# Patient Record
Sex: Female | Born: 1955 | Race: White | Hispanic: No | Marital: Single | Smoking: Current some day smoker
Health system: Southern US, Community
[De-identification: ages and names within clinical notes are randomized; demographics above are authoritative.]

## PROBLEM LIST (undated history)

## (undated) DIAGNOSIS — E119 Type 2 diabetes mellitus without complications: Secondary | ICD-10-CM

## (undated) DIAGNOSIS — H401190 Primary open-angle glaucoma, unspecified eye, stage unspecified: Secondary | ICD-10-CM

## (undated) DIAGNOSIS — I639 Cerebral infarction, unspecified: Secondary | ICD-10-CM

## (undated) DIAGNOSIS — M0520 Rheumatoid vasculitis with rheumatoid arthritis of unspecified site: Secondary | ICD-10-CM

## (undated) DIAGNOSIS — H548 Legal blindness, as defined in USA: Secondary | ICD-10-CM

## (undated) DIAGNOSIS — I499 Cardiac arrhythmia, unspecified: Secondary | ICD-10-CM

## (undated) DIAGNOSIS — J449 Chronic obstructive pulmonary disease, unspecified: Secondary | ICD-10-CM

## (undated) DIAGNOSIS — G40909 Epilepsy, unspecified, not intractable, without status epilepticus: Secondary | ICD-10-CM

## (undated) DIAGNOSIS — I959 Hypotension, unspecified: Secondary | ICD-10-CM

## (undated) DIAGNOSIS — J439 Emphysema, unspecified: Secondary | ICD-10-CM

## (undated) HISTORY — PX: BLADDER SUSPENSION: SHX72

## (undated) HISTORY — PX: EYE SURGERY: SHX253

---

## 2015-09-16 ENCOUNTER — Emergency Department (HOSPITAL_COMMUNITY): Payer: Medicare Other

## 2015-09-16 ENCOUNTER — Emergency Department (HOSPITAL_COMMUNITY)
Admission: EM | Admit: 2015-09-16 | Discharge: 2015-09-16 | Disposition: A | Discharge status: Home / Self Care | Payer: Medicare Other | Attending: Emergency Medicine | Admitting: Emergency Medicine

## 2015-09-16 ENCOUNTER — Encounter (HOSPITAL_COMMUNITY): Payer: Self-pay | Admitting: *Deleted

## 2015-09-16 DIAGNOSIS — E119 Type 2 diabetes mellitus without complications: Secondary | ICD-10-CM | POA: Insufficient documentation

## 2015-09-16 DIAGNOSIS — M79605 Pain in left leg: Secondary | ICD-10-CM | POA: Insufficient documentation

## 2015-09-16 DIAGNOSIS — G40909 Epilepsy, unspecified, not intractable, without status epilepticus: Secondary | ICD-10-CM | POA: Insufficient documentation

## 2015-09-16 DIAGNOSIS — R079 Chest pain, unspecified: Secondary | ICD-10-CM | POA: Diagnosis present

## 2015-09-16 DIAGNOSIS — M79604 Pain in right leg: Secondary | ICD-10-CM | POA: Insufficient documentation

## 2015-09-16 DIAGNOSIS — Z79899 Other long term (current) drug therapy: Secondary | ICD-10-CM | POA: Diagnosis not present

## 2015-09-16 DIAGNOSIS — R062 Wheezing: Secondary | ICD-10-CM

## 2015-09-16 DIAGNOSIS — H548 Legal blindness, as defined in USA: Secondary | ICD-10-CM | POA: Diagnosis not present

## 2015-09-16 DIAGNOSIS — R112 Nausea with vomiting, unspecified: Secondary | ICD-10-CM | POA: Diagnosis not present

## 2015-09-16 DIAGNOSIS — J441 Chronic obstructive pulmonary disease with (acute) exacerbation: Secondary | ICD-10-CM | POA: Diagnosis not present

## 2015-09-16 DIAGNOSIS — K002 Abnormalities of size and form of teeth: Secondary | ICD-10-CM | POA: Insufficient documentation

## 2015-09-16 DIAGNOSIS — F17200 Nicotine dependence, unspecified, uncomplicated: Secondary | ICD-10-CM | POA: Insufficient documentation

## 2015-09-16 DIAGNOSIS — R0789 Other chest pain: Secondary | ICD-10-CM | POA: Diagnosis not present

## 2015-09-16 DIAGNOSIS — M069 Rheumatoid arthritis, unspecified: Secondary | ICD-10-CM | POA: Insufficient documentation

## 2015-09-16 DIAGNOSIS — R2242 Localized swelling, mass and lump, left lower limb: Secondary | ICD-10-CM | POA: Diagnosis not present

## 2015-09-16 DIAGNOSIS — Z7951 Long term (current) use of inhaled steroids: Secondary | ICD-10-CM | POA: Insufficient documentation

## 2015-09-16 DIAGNOSIS — H409 Unspecified glaucoma: Secondary | ICD-10-CM | POA: Insufficient documentation

## 2015-09-16 DIAGNOSIS — R2241 Localized swelling, mass and lump, right lower limb: Secondary | ICD-10-CM | POA: Diagnosis not present

## 2015-09-16 DIAGNOSIS — Z8673 Personal history of transient ischemic attack (TIA), and cerebral infarction without residual deficits: Secondary | ICD-10-CM | POA: Insufficient documentation

## 2015-09-16 HISTORY — DX: Epilepsy, unspecified, not intractable, without status epilepticus: G40.909

## 2015-09-16 HISTORY — DX: Chronic obstructive pulmonary disease, unspecified: J44.9

## 2015-09-16 HISTORY — DX: Type 2 diabetes mellitus without complications: E11.9

## 2015-09-16 HISTORY — DX: Emphysema, unspecified: J43.9

## 2015-09-16 HISTORY — DX: Rheumatoid vasculitis with rheumatoid arthritis of unspecified site: M05.20

## 2015-09-16 HISTORY — DX: Cerebral infarction, unspecified: I63.9

## 2015-09-16 HISTORY — DX: Legal blindness, as defined in USA: H54.8

## 2015-09-16 HISTORY — DX: Hypotension, unspecified: I95.9

## 2015-09-16 HISTORY — DX: Primary open-angle glaucoma, unspecified eye, stage unspecified: H40.1190

## 2015-09-16 HISTORY — DX: Cardiac arrhythmia, unspecified: I49.9

## 2015-09-16 LAB — CBC
HEMATOCRIT: 43.3 % (ref 36.0–46.0)
HEMOGLOBIN: 14.2 g/dL (ref 12.0–15.0)
MCH: 31.8 pg (ref 26.0–34.0)
MCHC: 32.8 g/dL (ref 30.0–36.0)
MCV: 96.9 fL (ref 78.0–100.0)
Platelets: 244 10*3/uL (ref 150–400)
RBC: 4.47 MIL/uL (ref 3.87–5.11)
RDW: 13.2 % (ref 11.5–15.5)
WBC: 7.6 10*3/uL (ref 4.0–10.5)

## 2015-09-16 LAB — BRAIN NATRIURETIC PEPTIDE: B NATRIURETIC PEPTIDE 5: 11.8 pg/mL (ref 0.0–100.0)

## 2015-09-16 LAB — BASIC METABOLIC PANEL
ANION GAP: 8 (ref 5–15)
BUN: 15 mg/dL (ref 6–20)
CO2: 27 mmol/L (ref 22–32)
Calcium: 9.6 mg/dL (ref 8.9–10.3)
Chloride: 104 mmol/L (ref 101–111)
Creatinine, Ser: 0.79 mg/dL (ref 0.44–1.00)
GFR calc Af Amer: >60 mL/min (ref >60)
Glucose, Bld: 133 mg/dL — ABNORMAL HIGH (ref 65–99)
POTASSIUM: 3.4 mmol/L — AB (ref 3.5–5.1)
SODIUM: 139 mmol/L (ref 135–145)

## 2015-09-16 LAB — I-STAT TROPONIN, ED
TROPONIN I, POC: 0 ng/mL (ref 0.00–0.08)
TROPONIN I, POC: 0.03 ng/mL (ref 0.00–0.08)

## 2015-09-16 LAB — D-DIMER, QUANTITATIVE: D-Dimer, Quant: 0.56 ug/mL-FEU — ABNORMAL HIGH (ref 0.00–0.50)

## 2015-09-16 MED ORDER — SODIUM CHLORIDE 0.9 % IV BOLUS (SEPSIS): 500.0000 mL | Freq: Once | INTRAVENOUS | Status: DC

## 2015-09-16 MED ORDER — ONDANSETRON HCL 4 MG/2ML IJ SOLN
4.0000 mg | Freq: Once | INTRAMUSCULAR | Status: DC
  Filled 2015-09-16: qty 2

## 2015-09-16 MED ORDER — TECHNETIUM TC 99M DIETHYLENETRIAME-PENTAACETIC ACID: 31.2000 | Freq: Once | INTRAVENOUS | Status: DC | PRN

## 2015-09-16 MED ORDER — ALBUTEROL SULFATE (2.5 MG/3ML) 0.083% IN NEBU
5.0000 mg | INHALATION_SOLUTION | Freq: Once | RESPIRATORY_TRACT | Status: AC
  Administered 2015-09-16: 5 mg via RESPIRATORY_TRACT
  Filled 2015-09-16: qty 6

## 2015-09-16 MED ORDER — TECHNETIUM TO 99M ALBUMIN AGGREGATED
4.2000 | Freq: Once | INTRAVENOUS | Status: AC | PRN
  Administered 2015-09-16: 4 via INTRAVENOUS

## 2015-09-16 NOTE — ED Notes (Signed)
Pt c/o cramping in R leg; MD made aware

## 2015-09-16 NOTE — ED Notes (Signed)
Dr Yelverton at bedside.  

## 2015-09-16 NOTE — Discharge Instructions (Signed)
Nonspecific Chest Pain  °Chest pain can be caused by many different conditions. There is always a chance that your pain could be related to something serious, such as a heart attack or a blood clot in your lungs. Chest pain can also be caused by conditions that are not life-threatening. If you have chest pain, it is very important to follow up with your health care provider. °CAUSES  °Chest pain can be caused by: °· Heartburn. °· Pneumonia or bronchitis. °· Anxiety or stress. °· Inflammation around your heart (pericarditis) or lung (pleuritis or pleurisy). °· A blood clot in your lung. °· A collapsed lung (pneumothorax). It can develop suddenly on its own (spontaneous pneumothorax) or from trauma to the chest. °· Shingles infection (varicella-zoster virus). °· Heart attack. °· Damage to the bones, muscles, and cartilage that make up your chest wall. This can include: °¨ Bruised bones due to injury. °¨ Strained muscles or cartilage due to frequent or repeated coughing or overwork. °¨ Fracture to one or more ribs. °¨ Sore cartilage due to inflammation (costochondritis). °RISK FACTORS  °Risk factors for chest pain may include: °· Activities that increase your risk for trauma or injury to your chest. °· Respiratory infections or conditions that cause frequent coughing. °· Medical conditions or overeating that can cause heartburn. °· Heart disease or family history of heart disease. °· Conditions or health behaviors that increase your risk of developing a blood clot. °· Having had chicken pox (varicella zoster). °SIGNS AND SYMPTOMS °Chest pain can feel like: °· Burning or tingling on the surface of your chest or deep in your chest. °· Crushing, pressure, aching, or squeezing pain. °· Dull or sharp pain that is worse when you move, cough, or take a deep breath. °· Pain that is also felt in your back, neck, shoulder, or arm, or pain that spreads to any of these areas. °Your chest pain may come and go, or it may stay  constant. °DIAGNOSIS °Lab tests or other studies may be needed to find the cause of your pain. Your health care provider may have you take a test called an ambulatory ECG (electrocardiogram). An ECG records your heartbeat patterns at the time the test is performed. You may also have other tests, such as: °· Transthoracic echocardiogram (TTE). During echocardiography, sound waves are used to create a picture of all of the heart structures and to look at how blood flows through your heart. °· Transesophageal echocardiogram (TEE). This is a more advanced imaging test that obtains images from inside your body. It allows your health care provider to see your heart in finer detail. °· Cardiac monitoring. This allows your health care provider to monitor your heart rate and rhythm in real time. °· Holter monitor. This is a portable device that records your heartbeat and can help to diagnose abnormal heartbeats. It allows your health care provider to track your heart activity for several days, if needed. °· Stress tests. These can be done through exercise or by taking medicine that makes your heart beat more quickly. °· Blood tests. °· Imaging tests. °TREATMENT  °Your treatment depends on what is causing your chest pain. Treatment may include: °· Medicines. These may include: °¨ Acid blockers for heartburn. °¨ Anti-inflammatory medicine. °¨ Pain medicine for inflammatory conditions. °¨ Antibiotic medicine, if an infection is present. °¨ Medicines to dissolve blood clots. °¨ Medicines to treat coronary artery disease. °· Supportive care for conditions that do not require medicines. This may include: °¨ Resting. °¨ Applying heat   or cold packs to injured areas. °¨ Limiting activities until pain decreases. °HOME CARE INSTRUCTIONS °· If you were prescribed an antibiotic medicine, finish it all even if you start to feel better. °· Avoid any activities that bring on chest pain. °· Do not use any tobacco products, including  cigarettes, chewing tobacco, or electronic cigarettes. If you need help quitting, ask your health care provider. °· Do not drink alcohol. °· Take medicines only as directed by your health care provider. °· Keep all follow-up visits as directed by your health care provider. This is important. This includes any further testing if your chest pain does not go away. °· If heartburn is the cause for your chest pain, you may be told to keep your head raised (elevated) while sleeping. This reduces the chance that acid will go from your stomach into your esophagus. °· Make lifestyle changes as directed by your health care provider. These may include: °¨ Getting regular exercise. Ask your health care provider to suggest some activities that are safe for you. °¨ Eating a heart-healthy diet. A registered dietitian can help you to learn healthy eating options. °¨ Maintaining a healthy weight. °¨ Managing diabetes, if necessary. °¨ Reducing stress. °SEEK MEDICAL CARE IF: °· Your chest pain does not go away after treatment. °· You have a rash with blisters on your chest. °· You have a fever. °SEEK IMMEDIATE MEDICAL CARE IF:  °· Your chest pain is worse. °· You have an increasing cough, or you cough up blood. °· You have severe abdominal pain. °· You have severe weakness. °· You faint. °· You have chills. °· You have sudden, unexplained chest discomfort. °· You have sudden, unexplained discomfort in your arms, back, neck, or jaw. °· You have shortness of breath at any time. °· You suddenly start to sweat, or your skin gets clammy. °· You feel nauseous or you vomit. °· You suddenly feel light-headed or dizzy. °· Your heart begins to beat quickly, or it feels like it is skipping beats. °These symptoms may represent a serious problem that is an emergency. Do not wait to see if the symptoms will go away. Get medical help right away. Call your local emergency services (911 in the [place] Do not drive yourself to the hospital. °  °This  information is not intended to replace advice given to you by your health care provider. Make sure you discuss any questions you have with your health care provider. °  °Document Released: 07/20/2005 Document Revised: 10/31/2014 Document Reviewed: 05/16/2014 °Elsevier Interactive Patient Education ©2016 [employer] ° °
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
## 2015-09-16 NOTE — ED Notes (Signed)
Patient transported to radiology for VQ scan 
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
## 2016-03-26 IMAGING — CR DG CHEST 2V
2 series · 2 of 2 positions shown · non-contrast
Comparison: None.

CLINICAL DATA: Acute onset of left-sided chest pain. Initial
encounter.

EXAM:
CHEST  2 VIEW

[chest pa]
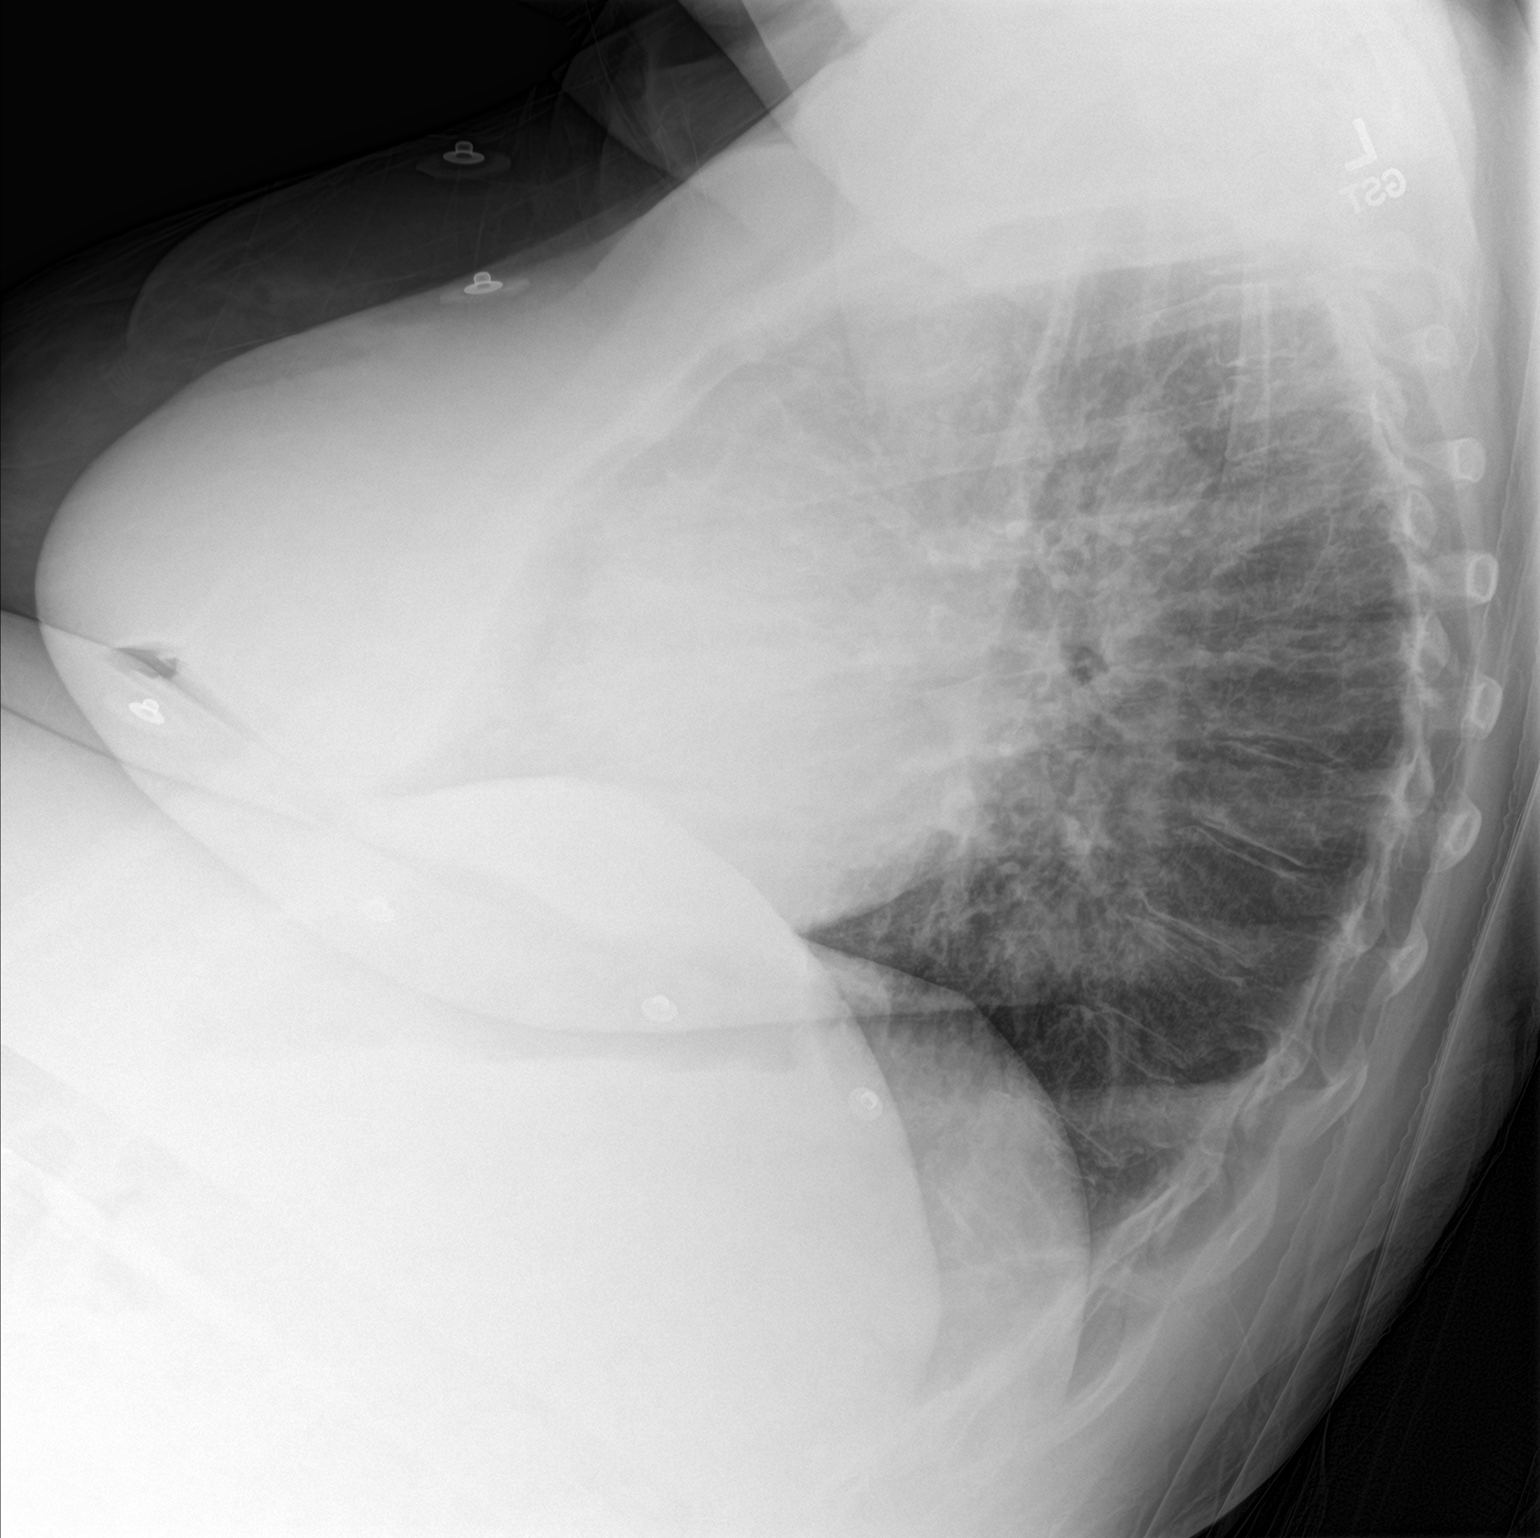

[chest ap]
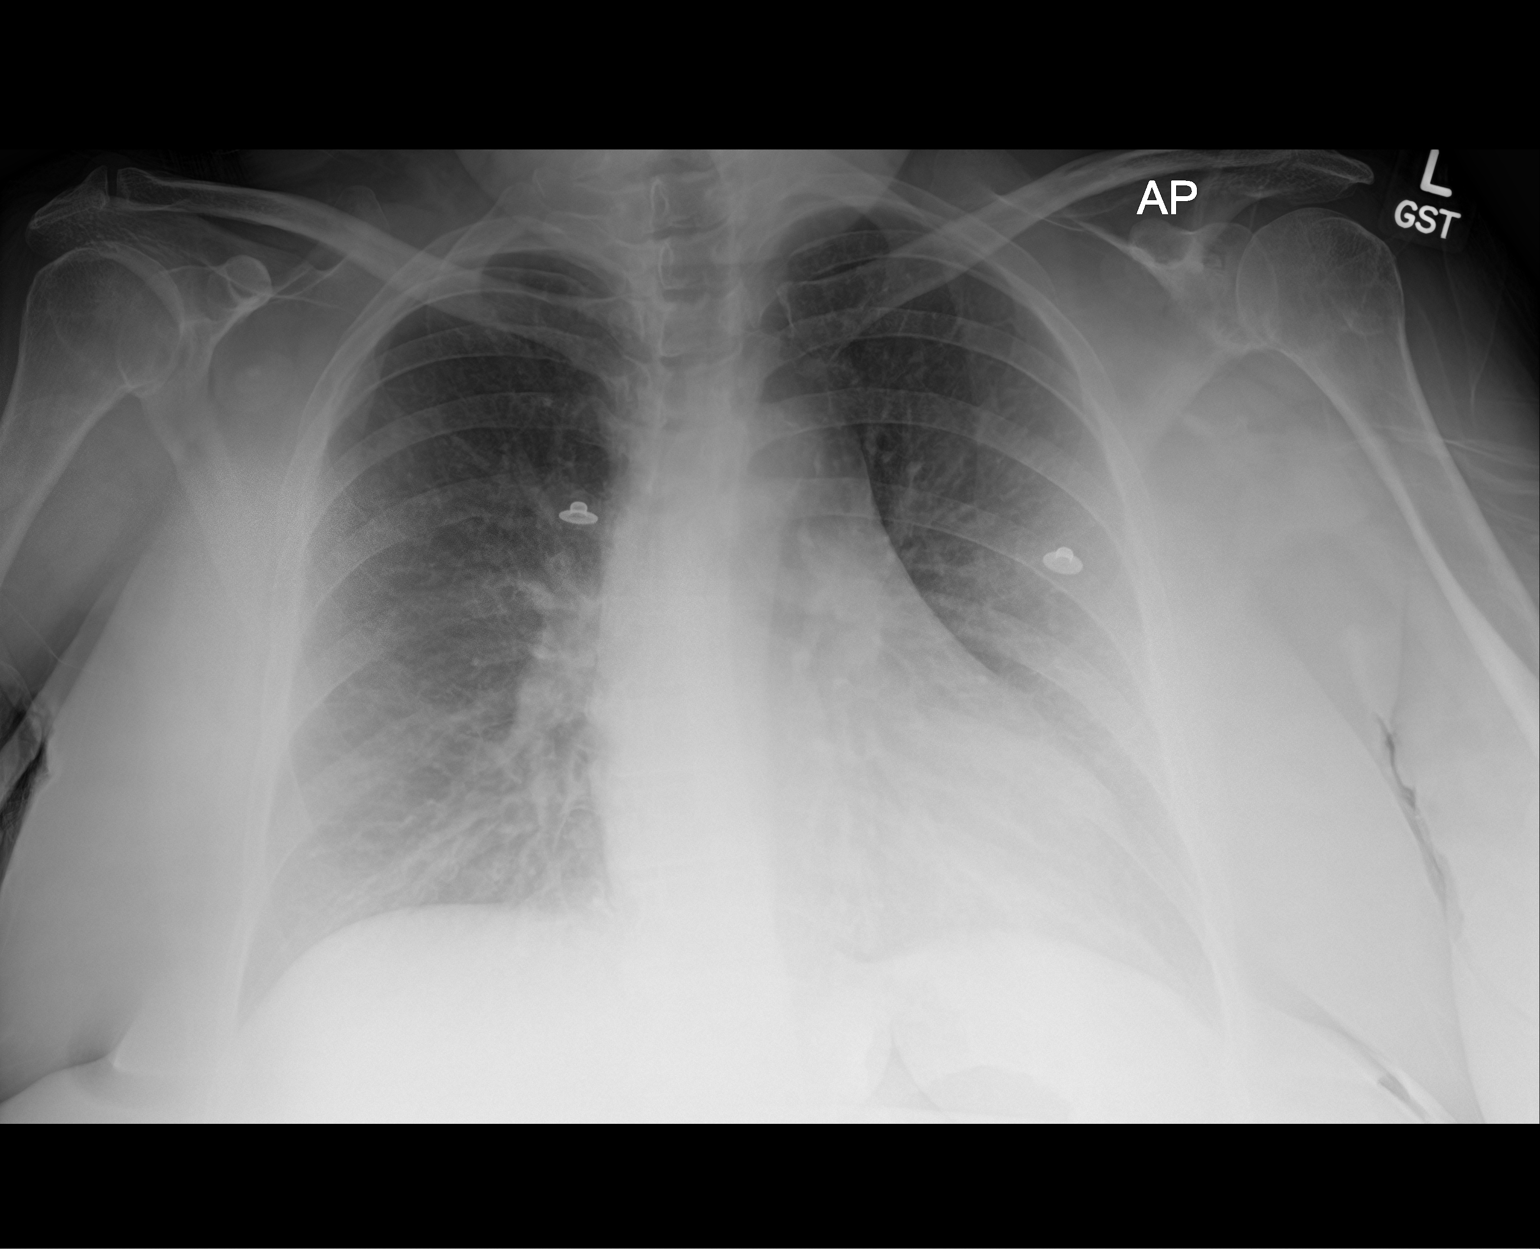

[2 of 2 positions shown; findings below may reference images not displayed]

FINDINGS: The lungs are well-aerated and clear. There is no evidence of focal
opacification, pleural effusion or pneumothorax.

The heart is normal in size; the mediastinal contour is within
normal limits. No acute osseous abnormalities are seen.
IMPRESSION: No acute cardiopulmonary process seen.
